# Patient Record
Sex: Female | Born: 1960 | Race: Black or African American | Hispanic: No | Marital: Single | Smoking: Current every day smoker
Health system: Southern US, Community
[De-identification: ages and names within clinical notes are randomized; demographics above are authoritative.]

## PROBLEM LIST (undated history)

## (undated) DIAGNOSIS — I10 Essential (primary) hypertension: Secondary | ICD-10-CM

## (undated) HISTORY — PX: DILATION AND CURETTAGE, DIAGNOSTIC / THERAPEUTIC: SUR384

## (undated) HISTORY — PX: RIGHT OOPHORECTOMY: SHX2359

## (undated) HISTORY — PX: DILATION AND CURETTAGE OF UTERUS: SHX78

---

## 2003-11-09 ENCOUNTER — Other Ambulatory Visit: Payer: Self-pay

## 2007-03-18 ENCOUNTER — Emergency Department (HOSPITAL_COMMUNITY): Admission: EM | Admit: 2007-03-18 | Discharge: 2007-03-18 | Payer: Self-pay | Admitting: Emergency Medicine

## 2009-08-19 ENCOUNTER — Inpatient Hospital Stay: Payer: Self-pay | Admitting: Internal Medicine

## 2009-12-02 ENCOUNTER — Ambulatory Visit: Payer: Self-pay | Admitting: Gastroenterology

## 2011-04-13 ENCOUNTER — Emergency Department: Payer: Self-pay | Admitting: Emergency Medicine

## 2011-06-30 LAB — URINALYSIS, ROUTINE W REFLEX MICROSCOPIC
Nitrite: NEGATIVE
Specific Gravity, Urine: 1.01
Urobilinogen, UA: 0.2

## 2011-06-30 LAB — DIFFERENTIAL
Basophils Absolute: 0
Basophils Relative: 0
Eosinophils Absolute: 0
Eosinophils Relative: 0
Monocytes Absolute: 0.4
Monocytes Relative: 2 — ABNORMAL LOW
Neutro Abs: 14 — ABNORMAL HIGH

## 2011-06-30 LAB — COMPREHENSIVE METABOLIC PANEL
ALT: 14
Albumin: 4.4
Alkaline Phosphatase: 143 — ABNORMAL HIGH
BUN: 6
Chloride: 110
Glucose, Bld: 122 — ABNORMAL HIGH
Potassium: 3.2 — ABNORMAL LOW
Sodium: 144
Total Bilirubin: 1.2

## 2011-06-30 LAB — URINE MICROSCOPIC-ADD ON

## 2011-06-30 LAB — CBC
HCT: 41.4
Hemoglobin: 14.7
Platelets: 418 — ABNORMAL HIGH
WBC: 16.1 — ABNORMAL HIGH

## 2011-06-30 LAB — POCT PREGNANCY, URINE
Operator id: 29011
Preg Test, Ur: NEGATIVE

## 2011-07-23 ENCOUNTER — Emergency Department: Payer: Self-pay | Admitting: Emergency Medicine

## 2016-04-17 ENCOUNTER — Encounter: Payer: Self-pay | Admitting: Emergency Medicine

## 2016-04-17 ENCOUNTER — Emergency Department: Payer: Self-pay

## 2016-04-17 DIAGNOSIS — M19019 Primary osteoarthritis, unspecified shoulder: Secondary | ICD-10-CM | POA: Insufficient documentation

## 2016-04-17 DIAGNOSIS — F1290 Cannabis use, unspecified, uncomplicated: Secondary | ICD-10-CM | POA: Insufficient documentation

## 2016-04-17 DIAGNOSIS — F17200 Nicotine dependence, unspecified, uncomplicated: Secondary | ICD-10-CM | POA: Insufficient documentation

## 2016-04-17 LAB — BASIC METABOLIC PANEL
ANION GAP: 7 (ref 5–15)
BUN: 22 mg/dL — AB (ref 6–20)
CHLORIDE: 107 mmol/L (ref 101–111)
CO2: 24 mmol/L (ref 22–32)
Calcium: 9.1 mg/dL (ref 8.9–10.3)
Creatinine, Ser: 0.61 mg/dL (ref 0.44–1.00)
GFR calc Af Amer: >60 mL/min (ref >60)
GLUCOSE: 93 mg/dL (ref 65–99)
POTASSIUM: 3.6 mmol/L (ref 3.5–5.1)
Sodium: 138 mmol/L (ref 135–145)

## 2016-04-17 LAB — CBC
HEMATOCRIT: 37.9 % (ref 35.0–47.0)
HEMOGLOBIN: 13.1 g/dL (ref 12.0–16.0)
MCH: 28.5 pg (ref 26.0–34.0)
MCHC: 34.6 g/dL (ref 32.0–36.0)
MCV: 82.2 fL (ref 80.0–100.0)
Platelets: 349 10*3/uL (ref 150–440)
RBC: 4.61 MIL/uL (ref 3.80–5.20)
RDW: 18.1 % — AB (ref 11.5–14.5)
WBC: 13 10*3/uL — AB (ref 3.6–11.0)

## 2016-04-17 LAB — TROPONIN I: Troponin I: <0.03 ng/mL (ref <0.03)

## 2016-04-17 NOTE — ED Triage Notes (Signed)
Pt states central chest pain with radiation to central upper back and shob for one week. Pt denies other symptoms. Pt states pain is worse when lying down.

## 2016-04-18 ENCOUNTER — Emergency Department: Payer: Self-pay

## 2016-04-18 ENCOUNTER — Emergency Department
Admission: EM | Admit: 2016-04-18 | Discharge: 2016-04-18 | Disposition: A | Discharge status: Home / Self Care | Payer: Self-pay | Attending: Emergency Medicine | Admitting: Emergency Medicine

## 2016-04-18 DIAGNOSIS — R079 Chest pain, unspecified: Secondary | ICD-10-CM

## 2016-04-18 DIAGNOSIS — M1990 Unspecified osteoarthritis, unspecified site: Secondary | ICD-10-CM

## 2016-04-18 LAB — TROPONIN I: Troponin I: <0.03 ng/mL (ref <0.03)

## 2016-04-18 MED ORDER — IOPAMIDOL (ISOVUE-370) INJECTION 76%
100.0000 mL | Freq: Once | INTRAVENOUS | Status: AC | PRN
  Administered 2016-04-18: 100 mL via INTRAVENOUS

## 2016-04-18 MED ORDER — MELOXICAM 15 MG PO TABS: 15.0000 mg | ORAL_TABLET | Freq: Every day | ORAL | 0 refills | Status: AC

## 2016-04-18 MED ORDER — TRAMADOL HCL 50 MG PO TABS
50.0000 mg | ORAL_TABLET | Freq: Once | ORAL | Status: AC
Start: 2016-04-18 — End: 2016-04-18
  Administered 2016-04-18: 50 mg via ORAL
  Filled 2016-04-18: qty 1

## 2016-04-18 MED ORDER — ASPIRIN 81 MG PO CHEW
324.0000 mg | CHEWABLE_TABLET | Freq: Once | ORAL | Status: AC
  Administered 2016-04-18: 324 mg via ORAL
  Filled 2016-04-18: qty 4

## 2016-04-18 NOTE — ED Provider Notes (Signed)
[facility] Emergency Department Provider Note   ____________________________________________   The patient was seen at approximately 4:16 AM   (approximate)  I have reviewed the triage vital signs and the nursing notes.   HISTORY  Chief Complaint Chest Pain    HPI Christine Pollard is a 55 y.o. female who comes into the hospital today with chest pain. The patient reports that the pain started a week ago. She reports the got really bad in the middle of the week. She reports it is mid chest pain and sharp on the right side. She's had some shortness of breath as well as some nonproductive cough. The patient denies fever. The pain is worse with deep inspiration as well as palpation to her upper chest. The patient reports she is been taking ibuprofen without any lesions. The patient rates her pain a 6 out of 10 in intensity. She denies any nausea or vomiting and reports that she has been sweaty. She also hasn't had any abdominal pain. The patient reports that she didn't know what the pain was and it persisted so she decided to come into the hospital for further evaluation.   No past medical history  There are no active problems to display for this patient.   Past Surgical History:  Procedure Laterality Date  . DILATION AND CURETTAGE, DIAGNOSTIC / THERAPEUTIC    . RIGHT OOPHORECTOMY      Prior to Admission medications   Not on File    Allergies Review of patient's allergies indicates no known allergies.  No family history on file.  Social History Social History  Substance Use Topics  . Smoking status: Current Every Day Smoker  . Smokeless tobacco: Never Used  . Alcohol use No    Review of Systems Constitutional: Sweats Eyes: No visual changes. ENT: No sore throat. Cardiovascular:  chest pain. Respiratory:  shortness of breath. Gastrointestinal: No abdominal pain.  No nausea, no vomiting.  No diarrhea.  No constipation. Genitourinary:  Negative for dysuria. Musculoskeletal: Negative for back pain. Skin: Negative for rash. Neurological: Negative for headaches, focal weakness or numbness.  10-point ROS otherwise negative.  ____________________________________________   PHYSICAL EXAM:  VITAL SIGNS: ED Triage Vitals  Enc Vitals Group     BP 04/17/16 2306 (!) 138/95     Pulse Rate 04/17/16 2306 95     Resp 04/17/16 2306 16     Temp 04/17/16 2306 98.4 F (36.9 C)     Temp Source 04/17/16 2306 Oral     SpO2 04/17/16 2306 100 %     Weight 04/17/16 2306 122 lb (55.3 kg)     Height 04/17/16 2306 5\' 5"  (1.651 m)     Head Circumference --      Peak Flow --      Pain Score 04/17/16 2307 7     Pain Loc --      Pain Edu? --      Excl. in GC? --     Constitutional: Alert and oriented. Well appearing and in mild distress. Eyes: Conjunctivae are normal. PERRL. EOMI. Head: Atraumatic. Nose: No congestion/rhinnorhea. Mouth/Throat: Mucous membranes are moist.  Oropharynx non-erythematous. Cardiovascular: Normal rate, regular rhythm. Grossly normal heart sounds.  Good peripheral circulation. Respiratory: Normal respiratory effort.  No retractions. Lungs CTAB. Anterior chest tender to palpation Gastrointestinal: Soft and nontender. No distention. Positive bowel sounds Musculoskeletal: No lower extremity tenderness nor edema.   Neurologic:  Normal speech and language.  Skin:  Skin is warm, dry  and intact.  Psychiatric: Mood and affect are normal.   ____________________________________________   LABS (all labs ordered are listed, but only abnormal results are displayed)  Labs Reviewed  BASIC METABOLIC PANEL - Abnormal; Notable for the following:       Result Value   BUN 22 (*)    All other components within normal limits  CBC - Abnormal; Notable for the following:    WBC 13.0 (*)    RDW 18.1 (*)    All other components within normal limits  TROPONIN I  TROPONIN I    ____________________________________________  EKG  ED ECG REPORT I, Rebecka Apley, the attending physician, personally viewed and interpreted this ECG.   Date: 04/17/2016  EKG Time: 2302  Rate: 97  Rhythm: normal sinus rhythm  Axis: normal  Intervals:none  ST&T Change: none  ____________________________________________  RADIOLOGY  CXR: No acute pulmonary process  CT angio chest: No acute pulmonary embolism nor acute cardiopulmonary process. 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 ____________________________________________   PROCEDURES  Procedure(s) performed: None  Procedures  Critical Care performed: No  ____________________________________________   INITIAL IMPRESSION / ASSESSMENT AND PLAN / ED COURSE  Pertinent labs & imaging results that were available during my care of the patient were reviewed by me and considered in my medical decision making (see chart for details).  This is a 55 year old female who comes into the hospital today with chest pain. The patient has had pain for a week. The patient's initial troponin is negative. I will give her some aspirin as well as tramadol and I will do a CTA of the patient's chest given her pleuritic pain. She will be reassessed.  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 I feel that that may be the cause of the patient's pain. The patient reports her pain is improved after the aspirin as well as tramadol. I will discharge the patient to home and have her follow-up with her primary care physician. She has no further complaint of concerns at this time. She'll be discharged.  ____________________________________________   FINAL CLINICAL IMPRESSION(S) / ED DIAGNOSES  Final diagnoses:  None      NEW MEDICATIONS STARTED DURING THIS VISIT:  New  Prescriptions   No medications on file     Note:  This document was prepared using Dragon voice recognition software and may include unintentional dictation errors.    Rebecka Apley, MD 04/18/16 [phone]

## 2017-06-26 IMAGING — CT CT ANGIO CHEST
2 of 6 series · 18 of 46 positions shown · IV contrast (APPLIED)
Comparison: Chest radiograph April 17, 2016

CLINICAL DATA: Central chest pain radiating to upper back,
shortness of breath for 1 week. Pain worse when lying down.

EXAM:
CT ANGIOGRAPHY CHEST WITH CONTRAST
TECHNIQUE: Multidetector CT imaging of the chest was performed using the
standard protocol during bolus administration of intravenous
contrast. Multiplanar CT image reconstructions and MIPs were
obtained to evaluate the vascular anatomy.
CONTRAST:  100 cc Isovue 370

[Series 5: thins · axial · 0.59mm/px · z∈[-552,-302]mm · 15 of 274 slices shown]
[im 12/274  lung]
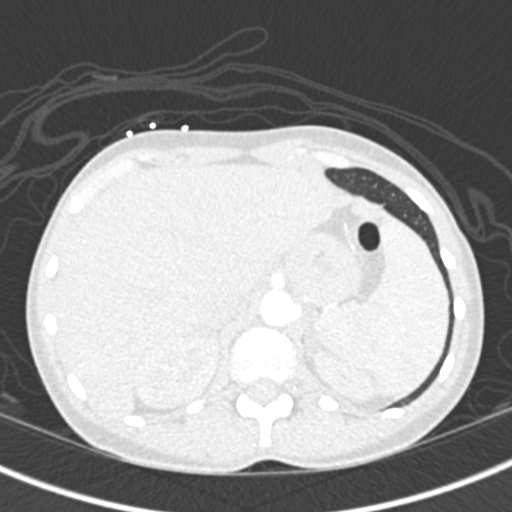
[im 36/274  soft-tissue]
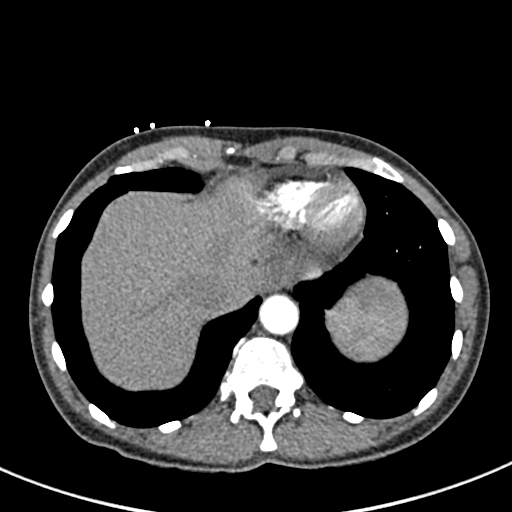
[im 48/274  lung]
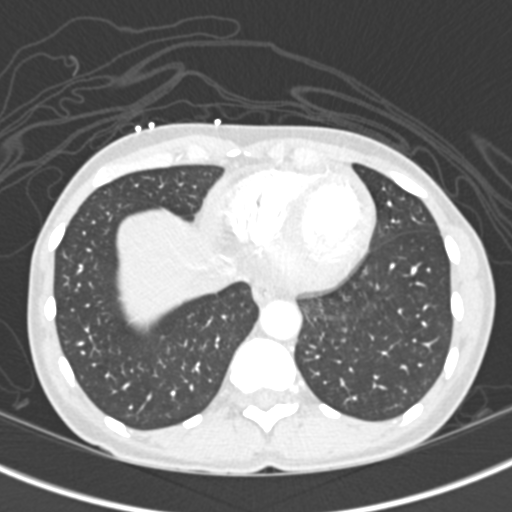
[im 72/274  soft-tissue]
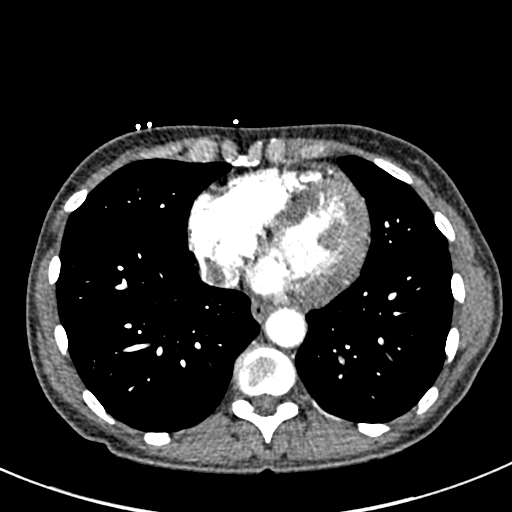
[im 84/274  lung]
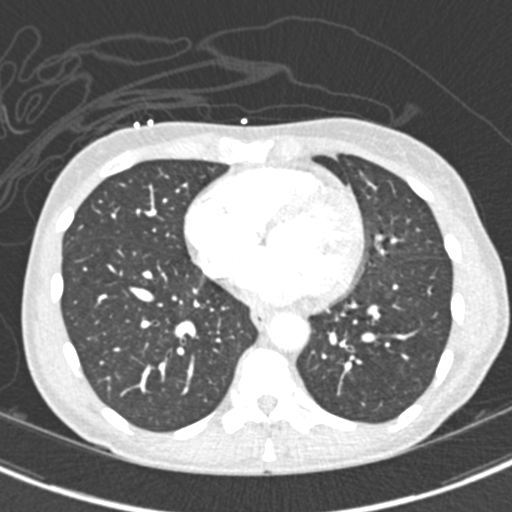
[im 107/274  soft-tissue]
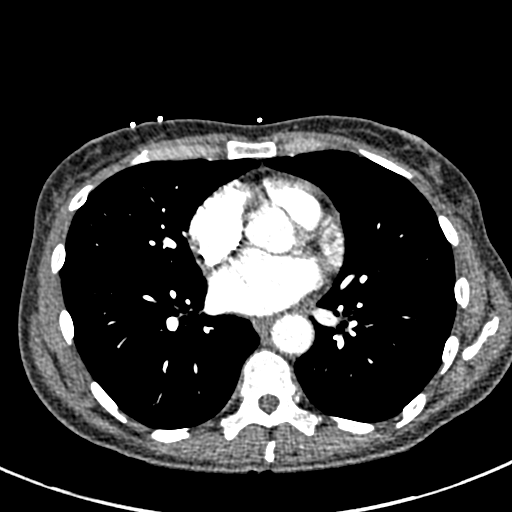
[im 119/274  lung]
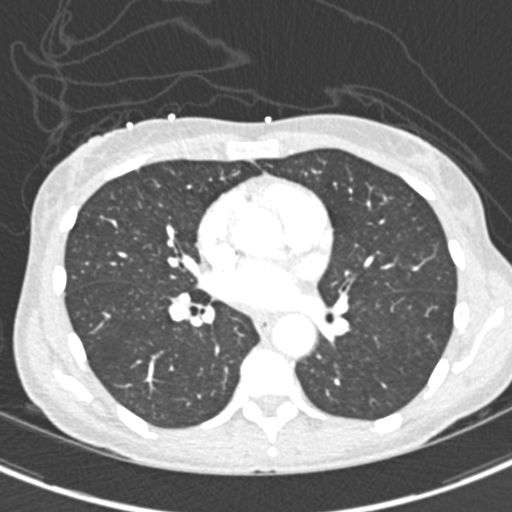
[im 143/274  soft-tissue]
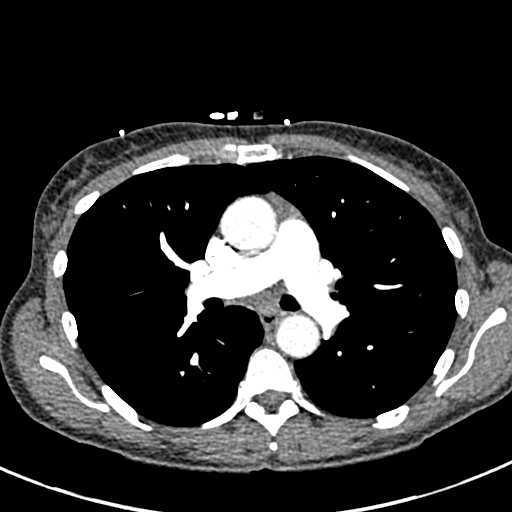
[im 155/274  lung]
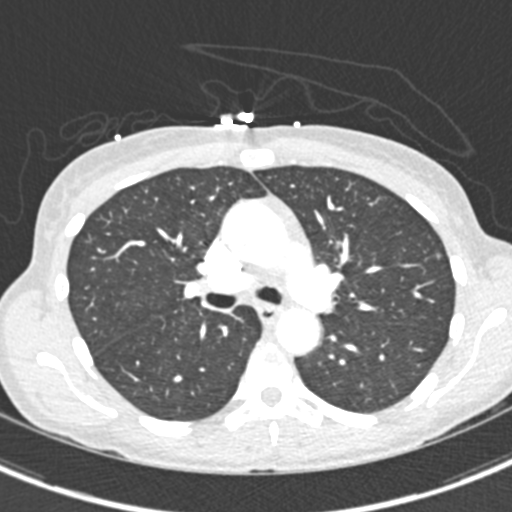
[im 167/274  soft-tissue]
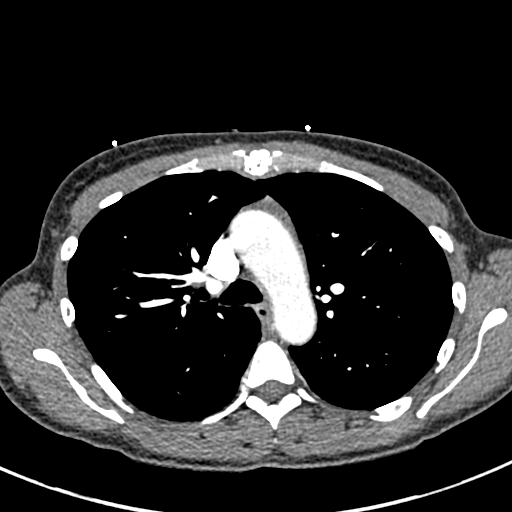
[im 190/274  lung]
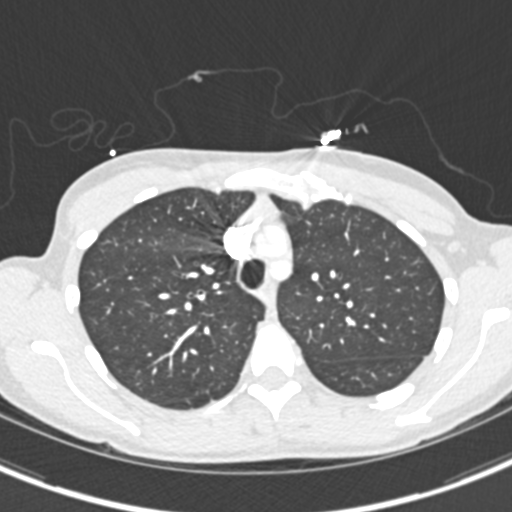
[im 202/274  soft-tissue]
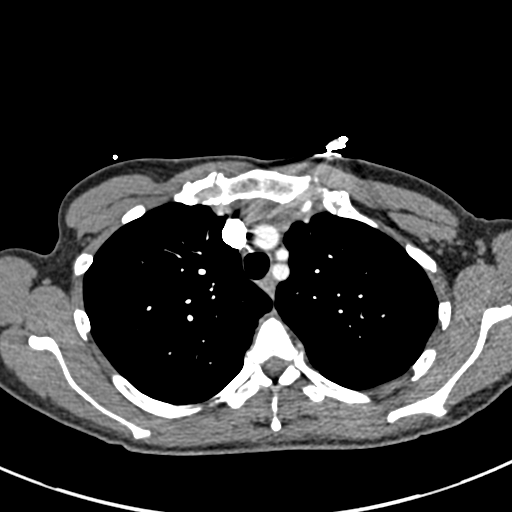
[im 226/274  lung]
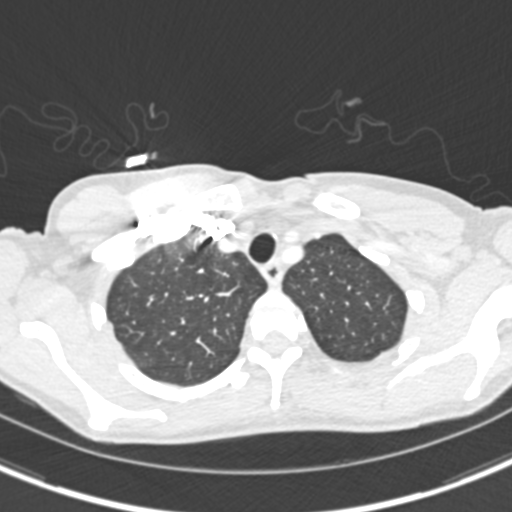
[im 238/274  soft-tissue]
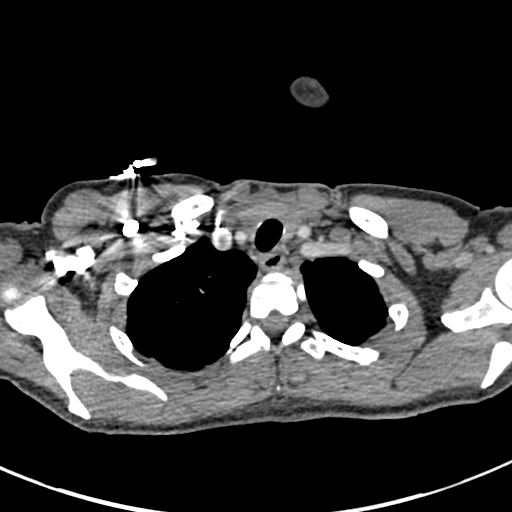
[im 262/274  lung]
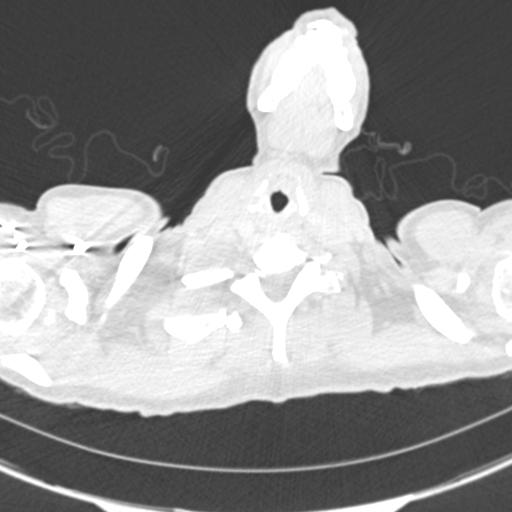

[Series 7: coronal mpr · coronal · 0.55mm/px · 3 of 101 slices shown]
[im 26/101  soft-tissue]
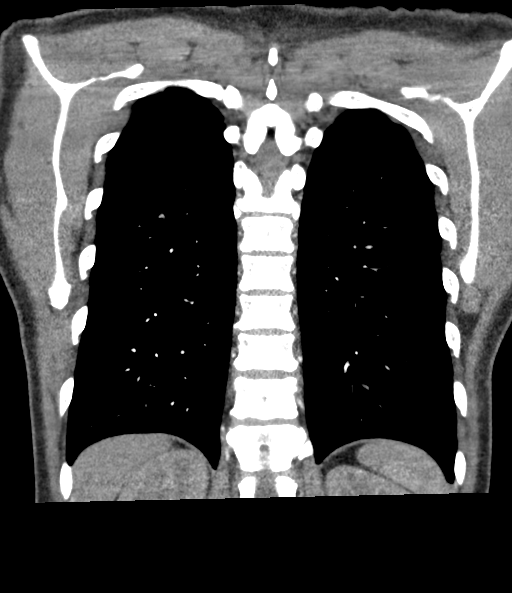
[im 51/101  soft-tissue]
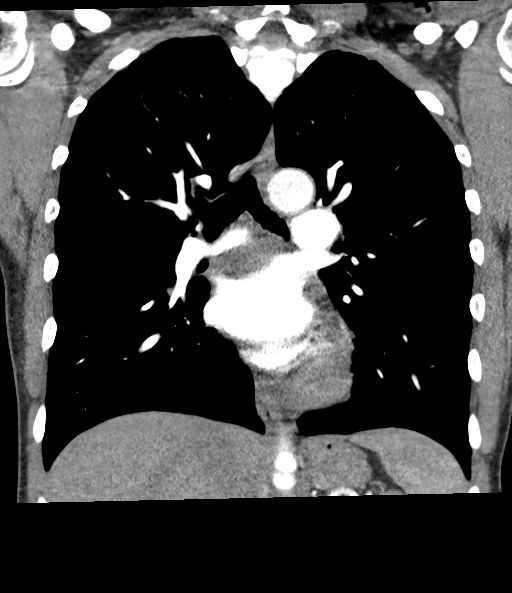
[im 76/101  soft-tissue]
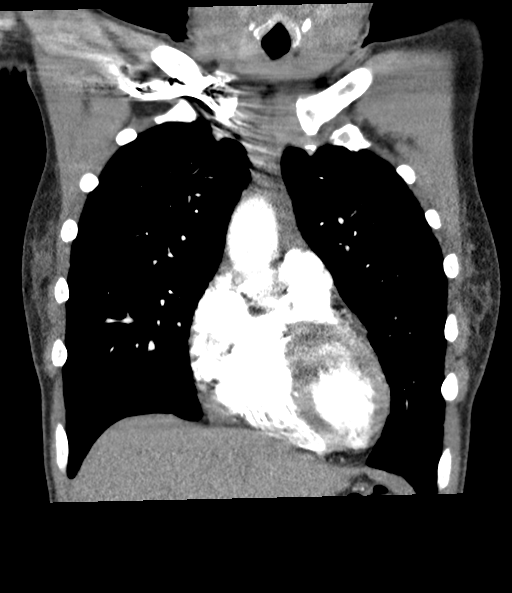

[18 of 46 positions shown; findings below may reference images not displayed]

FINDINGS: PULMONARY ARTERY: Adequate contrast opacification of the pulmonary
artery's. Main pulmonary artery is not enlarged. No pulmonary
arterial filling defects to the level of the subsegmental branches.

MEDIASTINUM: Heart is unremarkable, no right heart strain. Trace
pericardial effusion. Thoracic aorta is normal course and caliber,
trace calcific atherosclerosis. No lymphadenopathy by CT size
criteria.

LUNGS: Tracheobronchial tree is patent, no pneumothorax. No pleural
effusions, focal consolidations, pulmonary nodules or masses.

SOFT TISSUES AND OSSEOUS STRUCTURES: Included view of the abdomen is
unremarkable. Severe sternomanubrial osteoarthrosis with endplate
irregularity. Multiple periapical lucencies in the included view of
the mouth.

Review of the MIP images confirms the above findings.
IMPRESSION: No acute pulmonary embolism nor acute cardiopulmonary process.

Severe sternomanubrial osteoarthrosis with endplate irregularity,
erosive arthropathy may have this appearance.

## 2021-03-13 ENCOUNTER — Other Ambulatory Visit: Payer: Self-pay | Admitting: Primary Care

## 2021-03-13 ENCOUNTER — Ambulatory Visit
Admission: RE | Admit: 2021-03-13 | Discharge: 2021-03-13 | Disposition: A | Discharge status: Home / Self Care | Payer: Medicaid Other | Source: Ambulatory Visit | Attending: Primary Care | Admitting: Primary Care

## 2021-03-13 ENCOUNTER — Ambulatory Visit
Admission: RE | Admit: 2021-03-13 | Discharge: 2021-03-13 | Disposition: A | Discharge status: Home / Self Care | Payer: Medicaid Other | Attending: Primary Care | Admitting: Primary Care

## 2021-03-13 DIAGNOSIS — R609 Edema, unspecified: Secondary | ICD-10-CM | POA: Insufficient documentation

## 2022-02-07 ENCOUNTER — Encounter: Payer: Self-pay | Admitting: Emergency Medicine

## 2022-02-07 ENCOUNTER — Emergency Department
Admission: EM | Admit: 2022-02-07 | Discharge: 2022-02-07 | Disposition: A | Discharge status: Home / Self Care | Payer: Medicaid Other | Attending: Emergency Medicine | Admitting: Emergency Medicine

## 2022-02-07 ENCOUNTER — Other Ambulatory Visit: Payer: Self-pay

## 2022-02-07 DIAGNOSIS — K047 Periapical abscess without sinus: Secondary | ICD-10-CM

## 2022-02-07 DIAGNOSIS — I10 Essential (primary) hypertension: Secondary | ICD-10-CM | POA: Insufficient documentation

## 2022-02-07 DIAGNOSIS — K0889 Other specified disorders of teeth and supporting structures: Secondary | ICD-10-CM

## 2022-02-07 DIAGNOSIS — K029 Dental caries, unspecified: Secondary | ICD-10-CM

## 2022-02-07 HISTORY — DX: Essential (primary) hypertension: I10

## 2022-02-07 MED ORDER — AMOXICILLIN 500 MG PO CAPS: 500.0000 mg | ORAL_CAPSULE | Freq: Three times a day (TID) | ORAL | 0 refills | Status: DC

## 2022-02-07 MED ORDER — AMOXICILLIN 500 MG PO CAPS
500.0000 mg | ORAL_CAPSULE | Freq: Once | ORAL | Status: AC
  Administered 2022-02-07: 500 mg via ORAL
  Filled 2022-02-07: qty 1

## 2022-02-07 MED ORDER — IBUPROFEN 400 MG PO TABS
400.0000 mg | ORAL_TABLET | Freq: Once | ORAL | Status: AC
  Administered 2022-02-07: 400 mg via ORAL
  Filled 2022-02-07: qty 1

## 2022-02-07 MED ORDER — HYDROCODONE-ACETAMINOPHEN 5-325 MG PO TABS: 1.0000 | ORAL_TABLET | Freq: Four times a day (QID) | ORAL | 0 refills | Status: AC | PRN

## 2022-02-07 MED ORDER — LIDOCAINE VISCOUS HCL 2 % MT SOLN
15.0000 mL | Freq: Once | OROMUCOSAL | Status: AC
  Administered 2022-02-07: 15 mL via OROMUCOSAL
  Filled 2022-02-07: qty 15

## 2022-02-07 MED ORDER — ACETAMINOPHEN 325 MG PO TABS
650.0000 mg | ORAL_TABLET | Freq: Once | ORAL | Status: AC
  Administered 2022-02-07: 650 mg via ORAL
  Filled 2022-02-07: qty 2

## 2022-02-07 NOTE — Discharge Instructions (Signed)
1. Take antibiotic as prescribed (Amoxicillin 500mg three times daily x 7 days). °2. Take Ibuprofen as needed for pain; Norco as needed for more severe pain.   °3. Return to the ER for worsening symptoms, persistent vomiting, fever, difficulty breathing or other concerns. ° ° ° °

## 2022-02-07 NOTE — ED Notes (Signed)
Pt's BP elevated reports she has taken her medication does not know how hight her BP runs.

## 2022-02-07 NOTE — ED Provider Notes (Signed)
[facility] Provider Note    Event Date/Time   First MD Initiated Contact with Patient 02/07/22 0413     (approximate)   History   Dental Pain   HPI  Christine Pollard is a 61 y.o. female who presents to the ED from home with a chief complaint of right dentalgia.  Patient reports she has a dental appointment at [facility] next month.  Reports a 1 to 2-day history of right upper molar pain; presents due to facial swelling.  Denies fever, chills, chest pain, shortness of breath, abdominal pain, nausea, vomiting or dizziness.     Past Medical History   Past Medical History:  Diagnosis Date   Hypertension      Active Problem List  There are no problems to display for this patient.    Past Surgical History   Past Surgical History:  Procedure Laterality Date   DILATION AND CURETTAGE, DIAGNOSTIC / THERAPEUTIC     RIGHT OOPHORECTOMY       Home Medications   Prior to Admission medications   Medication Sig Start Date End Date Taking? Authorizing Provider  amoxicillin (AMOXIL) 500 MG capsule Take 1 capsule (500 mg total) by mouth 3 (three) times daily. 02/07/22  Yes Irean Hong, MD  HYDROcodone-acetaminophen (NORCO) 5-325 MG tablet Take 1 tablet by mouth every 6 (six) hours as needed for moderate pain. 02/07/22  Yes Irean Hong, MD     Allergies  Patient has no known allergies.   Family History  No family history on file.   Physical Exam  Triage Vital Signs: ED Triage Vitals  Enc Vitals Group     BP 02/07/22 0149 (!) 217/129     Pulse Rate 02/07/22 0149 (!) 103     Resp 02/07/22 0149 18     Temp 02/07/22 0149 98.5 F (36.9 C)     Temp Source 02/07/22 0149 Oral     SpO2 02/07/22 0149 97 %     Weight 02/07/22 0147 123 lb (55.8 kg)     Height 02/07/22 0147 5\' 3"  (1.6 m)     Head Circumference --      Peak Flow --      Pain Score 02/07/22 0147 10     Pain Loc --      Pain Edu? --      Excl. in GC? --     Updated Vital  Signs: BP (!) 197/109 (BP Location: Left Arm)   Pulse 92   Temp 98.7 F (37.1 C) (Oral)   Resp 16   Ht 5\' 3"  (1.6 m)   Wt 55.8 kg   LMP  (LMP Unknown)   SpO2 98%   BMI 21.79 kg/m    General: Awake, no distress.  CV:  RRR.  Good peripheral perfusion.  Resp:  Normal effort.  CTAB. Abd:  No distention.  Other:  Widespread dental decay, missing teeth.  Right upper molar loose with decay.  Mild right facial swelling.   ED Results / Procedures / Treatments  Labs (all labs ordered are listed, but only abnormal results are displayed) Labs Reviewed - No data to display   EKG  None   RADIOLOGY None   Official radiology report(s): No results found.   PROCEDURES:  Critical Care performed: No  Procedures   MEDICATIONS ORDERED IN ED: Medications  acetaminophen (TYLENOL) tablet 650 mg (650 mg Oral Given 02/07/22 0153)  ibuprofen (ADVIL) tablet 400 mg (400 mg Oral Given 02/07/22 0153)  lidocaine (XYLOCAINE) 2 % viscous mouth solution 15 mL (15 mLs Mouth/Throat Given 02/07/22 0520)  amoxicillin (AMOXIL) capsule 500 mg (500 mg Oral Given 02/07/22 0519)     IMPRESSION / MDM / ASSESSMENT AND PLAN / ED COURSE  I reviewed the triage vital signs and the nursing notes.                             61 year old female presenting with dentalgia and facial swelling.  Differential diagnosis includes but is not limited to dental abscess, facial cellulitis, abscess, etc.  I have personally reviewed patient's records and note mostly orthopedic visits from 2019 onwards.  Given that patient is not having systemic symptoms of sepsis, i.e. no fever, chills, nausea/vomiting, will trial oral antibiotics.  Feel CT imaging is not warranted at this time.  Patient driving; will discharge home with prescription for Norco as well as Amoxicillin.  Patient will call to move her dental visit sooner.  Strict return precautions given.  Patient verbalizes understanding and agrees with plan of care.       FINAL CLINICAL IMPRESSION(S) / ED DIAGNOSES   Final diagnoses:  Dentalgia  Pain due to dental caries  Dental infection     Rx / DC Orders   ED Discharge Orders          Ordered    amoxicillin (AMOXIL) 500 MG capsule  3 times daily        02/07/22 0427    HYDROcodone-acetaminophen (NORCO) 5-325 MG tablet  Every 6 hours PRN        02/07/22 0427             Note:  This document was prepared using Dragon voice recognition software and may include unintentional dictation errors.   Irean Hong, MD 02/07/22 [phone]

## 2022-02-07 NOTE — ED Triage Notes (Signed)
Pt presents to ER with right face swelling reports right upper dental pain, has an appointment at [facility] [place]. Pt talks in complete sentences no distress noted

## 2022-11-04 DIAGNOSIS — Z0131 Encounter for examination of blood pressure with abnormal findings: Secondary | ICD-10-CM | POA: Diagnosis not present

## 2022-11-04 DIAGNOSIS — M154 Erosive (osteo)arthritis: Secondary | ICD-10-CM | POA: Diagnosis not present

## 2022-11-04 DIAGNOSIS — Z0000 Encounter for general adult medical examination without abnormal findings: Secondary | ICD-10-CM | POA: Diagnosis not present

## 2022-11-04 DIAGNOSIS — Z1389 Encounter for screening for other disorder: Secondary | ICD-10-CM | POA: Diagnosis not present

## 2022-11-04 DIAGNOSIS — Z23 Encounter for immunization: Secondary | ICD-10-CM | POA: Diagnosis not present

## 2022-11-04 DIAGNOSIS — M7989 Other specified soft tissue disorders: Secondary | ICD-10-CM | POA: Diagnosis not present

## 2022-11-04 DIAGNOSIS — M1990 Unspecified osteoarthritis, unspecified site: Secondary | ICD-10-CM | POA: Diagnosis not present

## 2022-11-04 DIAGNOSIS — Z1331 Encounter for screening for depression: Secondary | ICD-10-CM | POA: Diagnosis not present

## 2022-11-04 DIAGNOSIS — I10 Essential (primary) hypertension: Secondary | ICD-10-CM | POA: Diagnosis not present

## 2022-11-05 ENCOUNTER — Other Ambulatory Visit: Payer: Self-pay | Admitting: Primary Care

## 2022-11-05 ENCOUNTER — Ambulatory Visit
Admission: RE | Admit: 2022-11-05 | Discharge: 2022-11-05 | Disposition: A | Discharge status: Home / Self Care | Payer: Medicaid Other | Attending: Primary Care | Admitting: Primary Care

## 2022-11-05 ENCOUNTER — Ambulatory Visit
Admission: RE | Admit: 2022-11-05 | Discharge: 2022-11-05 | Disposition: A | Discharge status: Home / Self Care | Payer: Medicaid Other | Source: Ambulatory Visit | Attending: Primary Care | Admitting: Primary Care

## 2022-11-05 DIAGNOSIS — M154 Erosive (osteo)arthritis: Secondary | ICD-10-CM | POA: Diagnosis not present

## 2022-11-05 DIAGNOSIS — R222 Localized swelling, mass and lump, trunk: Secondary | ICD-10-CM | POA: Diagnosis not present

## 2022-11-05 DIAGNOSIS — M19012 Primary osteoarthritis, left shoulder: Secondary | ICD-10-CM | POA: Diagnosis not present

## 2022-11-09 ENCOUNTER — Other Ambulatory Visit: Payer: Self-pay | Admitting: Primary Care

## 2022-11-09 DIAGNOSIS — Z1231 Encounter for screening mammogram for malignant neoplasm of breast: Secondary | ICD-10-CM

## 2022-11-12 DIAGNOSIS — M19031 Primary osteoarthritis, right wrist: Secondary | ICD-10-CM | POA: Diagnosis not present

## 2022-11-12 DIAGNOSIS — M25531 Pain in right wrist: Secondary | ICD-10-CM | POA: Diagnosis not present

## 2022-12-17 ENCOUNTER — Encounter: Payer: Self-pay | Admitting: Radiology

## 2022-12-17 ENCOUNTER — Ambulatory Visit
Admission: RE | Admit: 2022-12-17 | Discharge: 2022-12-17 | Disposition: A | Discharge status: Home / Self Care | Payer: Medicaid Other | Source: Ambulatory Visit | Attending: Primary Care | Admitting: Primary Care

## 2022-12-17 DIAGNOSIS — Z1231 Encounter for screening mammogram for malignant neoplasm of breast: Secondary | ICD-10-CM | POA: Diagnosis not present

## 2023-01-06 DIAGNOSIS — M545 Low back pain, unspecified: Secondary | ICD-10-CM | POA: Diagnosis not present

## 2023-01-06 DIAGNOSIS — I10 Essential (primary) hypertension: Secondary | ICD-10-CM | POA: Diagnosis not present

## 2023-01-21 ENCOUNTER — Other Ambulatory Visit: Payer: Self-pay

## 2023-01-29 ENCOUNTER — Encounter: Payer: Self-pay | Admitting: *Deleted

## 2023-02-02 ENCOUNTER — Telehealth: Payer: Self-pay

## 2023-02-02 ENCOUNTER — Telehealth: Payer: Self-pay | Admitting: *Deleted

## 2023-02-02 ENCOUNTER — Other Ambulatory Visit: Payer: Self-pay | Admitting: *Deleted

## 2023-02-02 DIAGNOSIS — Z1211 Encounter for screening for malignant neoplasm of colon: Secondary | ICD-10-CM

## 2023-02-02 MED ORDER — PEG 3350-KCL-NABCB-NACL-NASULF 236 G PO SOLR: 4000.0000 mL | Freq: Once | ORAL | 0 refills | Status: AC

## 2023-02-02 NOTE — Telephone Encounter (Signed)
Message left for patient to return my call.  

## 2023-02-02 NOTE — Telephone Encounter (Signed)
Gastroenterology Pre-Procedure Review  Request Date: 04/05/2023 Requesting Physician: Dr. Allegra Lai  PATIENT REVIEW QUESTIONS: The patient responded to the following health history questions as indicated:    1. Are you having any GI issues? no 2. Do you have a personal history of Polyps? no 3. Do you have a family history of Colon Cancer or Polyps? no 4. Diabetes Mellitus? no 5. Joint replacements in the past 12 months?no 6. Major health problems in the past 3 months?no 7. Any artificial heart valves, MVP, or defibrillator?no    MEDICATIONS & ALLERGIES:    Patient reports the following regarding taking any anticoagulation/antiplatelet therapy:   Plavix, Coumadin, Eliquis, Xarelto, Lovenox, Pradaxa, Brilinta, or Effient? no Aspirin? yes (81 mg)  Patient confirms/reports the following medications:  Current Outpatient Medications  Medication Sig Dispense Refill   amLODipine-benazepril (LOTREL) 10-20 MG capsule Take 1 capsule by mouth daily.     amoxicillin (AMOXIL) 500 MG capsule Take 1 capsule (500 mg total) by mouth 3 (three) times daily. 21 capsule 0   cyclobenzaprine (FLEXERIL) 10 MG tablet Take 10 mg by mouth at bedtime.     HYDROcodone-acetaminophen (NORCO) 5-325 MG tablet Take 1 tablet by mouth every 6 (six) hours as needed for moderate pain. 15 tablet 0   meloxicam (MOBIC) 15 MG tablet Take 15 mg by mouth daily.     No current facility-administered medications for this visit.    Patient confirms/reports the following allergies:  Allergies  Allergen Reactions   Chlorthalidone     Other reaction(s): NAUSEA    No orders of the defined types were placed in this encounter.   AUTHORIZATION INFORMATION Primary Insurance: 1D#: Group #:  Secondary Insurance: 1D#: Group #:  SCHEDULE INFORMATION: Date: 04/05/2023 Time: Location:  [facility]

## 2023-02-02 NOTE — Telephone Encounter (Signed)
Colonoscopy schedule on 04/05/2023 with Dr Allegra Lai at [facility]

## 2023-02-02 NOTE — Telephone Encounter (Signed)
Patient is calling to schedule her colonoscopy. Please call patient

## 2023-02-04 DIAGNOSIS — M7022 Olecranon bursitis, left elbow: Secondary | ICD-10-CM | POA: Diagnosis not present

## 2023-02-04 DIAGNOSIS — Z1389 Encounter for screening for other disorder: Secondary | ICD-10-CM | POA: Diagnosis not present

## 2023-02-04 DIAGNOSIS — Z0131 Encounter for examination of blood pressure with abnormal findings: Secondary | ICD-10-CM | POA: Diagnosis not present

## 2023-02-04 DIAGNOSIS — I10 Essential (primary) hypertension: Secondary | ICD-10-CM | POA: Diagnosis not present

## 2023-02-23 DIAGNOSIS — M272 Inflammatory conditions of jaws: Secondary | ICD-10-CM | POA: Diagnosis not present

## 2023-03-09 DIAGNOSIS — M7022 Olecranon bursitis, left elbow: Secondary | ICD-10-CM | POA: Diagnosis not present

## 2023-03-09 DIAGNOSIS — Z0130 Encounter for examination of blood pressure without abnormal findings: Secondary | ICD-10-CM | POA: Diagnosis not present

## 2023-03-09 DIAGNOSIS — I10 Essential (primary) hypertension: Secondary | ICD-10-CM | POA: Diagnosis not present

## 2023-03-09 DIAGNOSIS — Z1389 Encounter for screening for other disorder: Secondary | ICD-10-CM | POA: Diagnosis not present

## 2023-03-31 ENCOUNTER — Encounter: Payer: Self-pay | Admitting: Gastroenterology

## 2023-04-05 ENCOUNTER — Ambulatory Visit: Payer: Medicaid Other | Admitting: Anesthesiology

## 2023-04-05 ENCOUNTER — Encounter
Admission: RE | Disposition: A | Discharge status: Home / Self Care | Payer: Self-pay | Source: Home / Self Care | Attending: Gastroenterology

## 2023-04-05 ENCOUNTER — Encounter: Payer: Self-pay | Admitting: Gastroenterology

## 2023-04-05 ENCOUNTER — Ambulatory Visit
Admission: RE | Admit: 2023-04-05 | Discharge: 2023-04-05 | Disposition: A | Discharge status: Home / Self Care | Payer: Medicaid Other | Source: Home / Self Care | Attending: Gastroenterology | Admitting: Gastroenterology

## 2023-04-05 DIAGNOSIS — Z1211 Encounter for screening for malignant neoplasm of colon: Secondary | ICD-10-CM | POA: Diagnosis not present

## 2023-04-05 DIAGNOSIS — I10 Essential (primary) hypertension: Secondary | ICD-10-CM | POA: Diagnosis not present

## 2023-04-05 DIAGNOSIS — F17200 Nicotine dependence, unspecified, uncomplicated: Secondary | ICD-10-CM | POA: Diagnosis not present

## 2023-04-05 HISTORY — PX: COLONOSCOPY WITH PROPOFOL: SHX5780

## 2023-04-05 SURGERY — COLONOSCOPY WITH PROPOFOL
Anesthesia: General

## 2023-04-05 MED ORDER — STERILE WATER FOR IRRIGATION IR SOLN
Status: DC | PRN
  Administered 2023-04-05 (×2): 50 mL
  Administered 2023-04-05: 100 mL
  Administered 2023-04-05: 50 mL

## 2023-04-05 MED ORDER — PROPOFOL 10 MG/ML IV BOLUS
INTRAVENOUS | Status: DC | PRN
  Administered 2023-04-05: 20 mg via INTRAVENOUS
  Administered 2023-04-05: 25 mg via INTRAVENOUS
  Administered 2023-04-05: 150 ug/kg/min via INTRAVENOUS

## 2023-04-05 MED ORDER — LABETALOL HCL 5 MG/ML IV SOLN
INTRAVENOUS | Status: AC
  Filled 2023-04-05: qty 4

## 2023-04-05 MED ORDER — LIDOCAINE HCL (CARDIAC) PF 100 MG/5ML IV SOSY
PREFILLED_SYRINGE | INTRAVENOUS | Status: DC | PRN
  Administered 2023-04-05: 50 mg via INTRAVENOUS

## 2023-04-05 MED ORDER — SODIUM CHLORIDE 0.9 % IV SOLN
INTRAVENOUS | Status: DC
  Administered 2023-04-05: 1000 mL via INTRAVENOUS

## 2023-04-05 MED ORDER — LABETALOL HCL 5 MG/ML IV SOLN
INTRAVENOUS | Status: DC | PRN
  Administered 2023-04-05 (×3): 5 mg via INTRAVENOUS

## 2023-04-05 NOTE — H&P (Signed)
Arlyss Repress, MD [address]  [address]  [place], [place] [address]  Main: [phone]  Fax: [phone] Pager: [phone]  Primary Care Physician:  Sandrea Hughs, NP Primary Gastroenterologist:  Dr. Arlyss Repress  Pre-Procedure History & Physical: HPI:  Christine Pollard is a 62 y.o. female is here for an colonoscopy.   Past Medical History:  Diagnosis Date   Hypertension     Past Surgical History:  Procedure Laterality Date   DILATION AND CURETTAGE OF UTERUS     DILATION AND CURETTAGE, DIAGNOSTIC / THERAPEUTIC     RIGHT OOPHORECTOMY      Prior to Admission medications   Medication Sig Start Date End Date Taking? Authorizing Provider  amLODipine-benazepril (LOTREL) 10-20 MG capsule Take 1 capsule by mouth daily. 02/01/23  Yes [provider]  cyclobenzaprine (FLEXERIL) 10 MG tablet Take 10 mg by mouth at bedtime. 02/01/23  Yes [provider]  HYDROcodone-acetaminophen (NORCO) 5-325 MG tablet Take 1 tablet by mouth every 6 (six) hours as needed for moderate pain. 02/07/22  Yes Irean Hong, MD  meloxicam (MOBIC) 15 MG tablet Take 15 mg by mouth daily.   Yes [provider]  amoxicillin (AMOXIL) 500 MG capsule Take 1 capsule (500 mg total) by mouth 3 (three) times daily. Patient not taking: Reported on 04/05/2023 02/07/22   Irean Hong, MD    Allergies as of 02/02/2023 - Review Complete 02/07/2022  Allergen Reaction Noted   Chlorthalidone  01/02/2013    Family History  Problem Relation Age of Onset   Breast cancer Paternal Grandmother     Social History   Socioeconomic History   Marital status: Single    Spouse name: Not on file   Number of children: Not on file   Years of education: Not on file   Highest education level: Not on file  Occupational History   Not on file  Tobacco Use   Smoking status: Every Day   Smokeless tobacco: Never  Vaping Use   Vaping status: Never Used  Substance and Sexual Activity   Alcohol  use: No   Drug use: Yes    Types: Marijuana    Comment: none in past week   Sexual activity: Not on file  Other Topics Concern   Not on file  Social History Narrative   Not on file   Social Determinants of Health   Financial Resource Strain: Not on file  Food Insecurity: Not on file  Transportation Needs: Not on file  Physical Activity: Not on file  Stress: Not on file  Social Connections: Not on file  Intimate Partner Violence: Not on file    Review of Systems: See HPI, otherwise negative ROS  Physical Exam: BP (!) 172/99   Pulse (!) 102   Temp (!) 97.1 F (36.2 C) (Temporal)   Resp 16   Ht 5\' 5"  (1.651 m)   Wt 56.7 kg   LMP  (LMP Unknown)   SpO2 100%   BMI 20.81 kg/m  General:   Alert,  pleasant and cooperative in NAD Head:  Normocephalic and atraumatic. Neck:  Supple; no masses or thyromegaly. Lungs:  Clear throughout to auscultation.    Heart:  Regular rate and rhythm. Abdomen:  Soft, nontender and nondistended. Normal bowel sounds, without guarding, and without rebound.   Neurologic:  Alert and  oriented x4;  grossly normal neurologically.  Impression/Plan: Christine Pollard is here for an colonoscopy to be performed for colon cancer screening  Risks, benefits, limitations, and alternatives regarding  colonoscopy have been reviewed with the patient.  Questions have been answered.  All parties agreeable.   Lannette Donath, MD  04/05/2023, 10:54 AM

## 2023-04-05 NOTE — Anesthesia Preprocedure Evaluation (Signed)
Anesthesia Evaluation  Patient identified by MRN, date of birth, ID band Patient awake    Reviewed: Allergy & Precautions, NPO status , Patient's Chart, lab work & pertinent test results  History of Anesthesia Complications Negative for: history of anesthetic complications  Airway Mallampati: III  TM Distance: <3 FB Neck ROM: full    Dental  (+) Chipped, Poor Dentition, Missing   Pulmonary COPD, Current Smoker    + decreased breath sounds      Cardiovascular Exercise Tolerance: Good hypertension, (-) angina Normal cardiovascular exam     Neuro/Psych negative neurological ROS  negative psych ROS   GI/Hepatic negative GI ROS, Neg liver ROS,neg GERD  ,,  Endo/Other  negative endocrine ROS    Renal/GU negative Renal ROS  negative genitourinary   Musculoskeletal   Abdominal   Peds  Hematology negative hematology ROS (+)   Anesthesia Other Findings Past Medical History: No date: Hypertension  Past Surgical History: No date: DILATION AND CURETTAGE OF UTERUS No date: DILATION AND CURETTAGE, DIAGNOSTIC / THERAPEUTIC No date: RIGHT OOPHORECTOMY  BMI    Body Mass Index: 20.81 kg/m      Reproductive/Obstetrics negative OB ROS                             Anesthesia Physical Anesthesia Plan  ASA: 3  Anesthesia Plan: General   Post-op Pain Management:    Induction: Intravenous  PONV Risk Score and Plan: Propofol infusion and TIVA  Airway Management Planned: Natural Airway and Nasal Cannula  Additional Equipment:   Intra-op Plan:   Post-operative Plan:   Informed Consent: I have reviewed the patients History and Physical, chart, labs and discussed the procedure including the risks, benefits and alternatives for the proposed anesthesia with the patient or authorized representative who has indicated his/her understanding and acceptance.     Dental Advisory Given  Plan Discussed  with: Anesthesiologist, CRNA and Surgeon  Anesthesia Plan Comments: (Patient consented for risks of anesthesia including but not limited to:  - adverse reactions to medications - risk of airway placement if required - damage to eyes, teeth, lips or other oral mucosa - nerve damage due to positioning  - sore throat or hoarseness - Damage to heart, brain, nerves, lungs, other parts of body or loss of life  Patient voiced understanding.)       Anesthesia Quick Evaluation

## 2023-04-05 NOTE — Transfer of Care (Signed)
Immediate Anesthesia Transfer of Care Note  Patient: Christine Pollard  Procedure(s) Performed: COLONOSCOPY WITH PROPOFOL  Patient Location: PACU  Anesthesia Type:General  Level of Consciousness: drowsy  Airway & Oxygen Therapy: Patient Spontanous Breathing  Post-op Assessment: Report given to RN  Post vital signs: stable  Last Vitals:  Vitals Value Taken Time  BP    Temp    Pulse 85 04/05/23 1131  Resp 20 04/05/23 1131  SpO2 100 % 04/05/23 1131  Vitals shown include unfiled device data.  Last Pain:  Vitals:   04/05/23 1005  TempSrc: Temporal  PainSc: 0-No pain         Complications: No notable events documented.

## 2023-04-05 NOTE — Op Note (Signed)
[facility]astroenterology Patient Name: Christine Pollard Procedure Date: 04/05/2023 10:54 AM MRN: [id number] Account #: [id number] Date of Birth: 19-Sep-1960 Admit Type: Outpatient Age: 62 Room: [facility] ENDO ROOM 4 Gender: Female Note Status: Finalized Instrument Name: Peds Colonoscope [id number] Procedure:             Colonoscopy Indications:           Screening for colorectal malignant neoplasm, This is                         the patient's first colonoscopy Providers:             Toney Reil MD, MD Referring MD:          Dorcas Mcmurray (Referring MD) Medicines:             General Anesthesia Complications:         No immediate complications. Estimated blood loss: None. Procedure:             Pre-Anesthesia Assessment:                        - Prior to the procedure, a History and Physical was                         performed, and patient medications and allergies were                         reviewed. The patient is competent. The risks and                         benefits of the procedure and the sedation options and                         risks were discussed with the patient. All questions                         were answered and informed consent was obtained.                         Patient identification and proposed procedure were                         verified by the physician, the nurse, the                         anesthesiologist, the anesthetist and the technician                         in the pre-procedure area in the procedure room in the                         endoscopy suite. Mental Status Examination: alert and                         oriented. Airway Examination: normal oropharyngeal                         airway and neck mobility. Respiratory Examination:  clear to auscultation. CV Examination: normal.                         Prophylactic Antibiotics: The patient does not require                          prophylactic antibiotics. Prior Anticoagulants: The                         patient has taken no anticoagulant or antiplatelet                         agents. ASA Grade Assessment: II - A patient with mild                         systemic disease. After reviewing the risks and                         benefits, the patient was deemed in satisfactory                         condition to undergo the procedure. The anesthesia                         plan was to use general anesthesia. Immediately prior                         to administration of medications, the patient was                         re-assessed for adequacy to receive sedatives. The                         heart rate, respiratory rate, oxygen saturations,                         blood pressure, adequacy of pulmonary ventilation, and                         response to care were monitored throughout the                         procedure. The physical status of the patient was                         re-assessed after the procedure.                        After obtaining informed consent, the colonoscope was                         passed under direct vision. Throughout the procedure,                         the patient's blood pressure, pulse, and oxygen                         saturations were monitored continuously. The  Colonoscope was introduced through the anus and                         advanced to the the cecum, identified by appendiceal                         orifice and ileocecal valve. The colonoscopy was                         performed with moderate difficulty due to significant                         looping and a tortuous colon. Successful completion of                         the procedure was aided by applying abdominal                         pressure. The patient tolerated the procedure well.                         The quality of the bowel preparation was adequate to                          identify polyps greater than 5 mm in size. The                         ileocecal valve, appendiceal orifice, and rectum were                         photographed. Findings:      The perianal and digital rectal examinations were normal. Pertinent       negatives include normal sphincter tone and no palpable rectal lesions.      The entire examined colon appeared normal.      The retroflexed view of the distal rectum and anal verge was normal and       showed no anal or rectal abnormalities. Impression:            - The entire examined colon is normal.                        - The distal rectum and anal verge are normal on                         retroflexion view.                        - No specimens collected. Recommendation:        - Discharge patient to home (with escort).                        - Resume previous diet today.                        - Continue present medications.                        - Repeat colonoscopy in 10 years for screening  purposes. Procedure Code(s):     --- Professional ---                        Q2595, Colorectal cancer screening; colonoscopy on                         individual not meeting criteria for high risk Diagnosis Code(s):     --- Professional ---                        Z12.11, Encounter for screening for malignant neoplasm                         of colon CPT copyright 2022 [employer]. All rights reserved. The codes documented in this report are preliminary and upon coder review may  be revised to meet current compliance requirements. Dr. Libby Maw Toney Reil MD, MD 04/05/2023 11:30:36 AM This report has been signed electronically. Number of Addenda: 0 Note Initiated On: 04/05/2023 10:54 AM Scope Withdrawal Time: 0 hours 6 minutes 58 seconds  Total Procedure Duration: 0 hours 16 minutes 30 seconds  Estimated Blood Loss:  Estimated blood loss: none.      [facility]

## 2023-04-05 NOTE — Anesthesia Postprocedure Evaluation (Signed)
Anesthesia Post Note  Patient: Christine Pollard  Procedure(s) Performed: COLONOSCOPY WITH PROPOFOL  Patient location during evaluation: Endoscopy Anesthesia Type: General Level of consciousness: awake and alert Pain management: pain level controlled Vital Signs Assessment: post-procedure vital signs reviewed and stable Respiratory status: spontaneous breathing, nonlabored ventilation, respiratory function stable and patient connected to nasal cannula oxygen Cardiovascular status: blood pressure returned to baseline and stable Postop Assessment: no apparent nausea or vomiting Anesthetic complications: no   There were no known notable events for this encounter.   Last Vitals:  Vitals:   04/05/23 1141 04/05/23 1151  BP:  (!) 157/98  Pulse: 85   Resp:    Temp:    SpO2: 100%     Last Pain:  Vitals:   04/05/23 1151  TempSrc:   PainSc: 0-No pain                 Cleda Mccreedy Susano Cleckler

## 2023-04-06 ENCOUNTER — Encounter: Payer: Self-pay | Admitting: Gastroenterology

## 2023-07-05 DIAGNOSIS — M278 Other specified diseases of jaws: Secondary | ICD-10-CM | POA: Diagnosis not present

## 2023-07-05 DIAGNOSIS — M1909 Primary osteoarthritis, other specified site: Secondary | ICD-10-CM | POA: Diagnosis not present

## 2023-08-26 DIAGNOSIS — Z87891 Personal history of nicotine dependence: Secondary | ICD-10-CM | POA: Diagnosis not present

## 2023-08-26 DIAGNOSIS — D165 Benign neoplasm of lower jaw bone: Secondary | ICD-10-CM | POA: Diagnosis not present

## 2023-08-26 DIAGNOSIS — M272 Inflammatory conditions of jaws: Secondary | ICD-10-CM | POA: Diagnosis not present

## 2023-08-26 DIAGNOSIS — D164 Benign neoplasm of bones of skull and face: Secondary | ICD-10-CM | POA: Diagnosis not present

## 2023-08-26 DIAGNOSIS — M869 Osteomyelitis, unspecified: Secondary | ICD-10-CM | POA: Diagnosis not present

## 2023-08-26 DIAGNOSIS — I10 Essential (primary) hypertension: Secondary | ICD-10-CM | POA: Diagnosis not present

## 2023-10-14 DIAGNOSIS — Z1331 Encounter for screening for depression: Secondary | ICD-10-CM | POA: Diagnosis not present

## 2023-10-14 DIAGNOSIS — Z0130 Encounter for examination of blood pressure without abnormal findings: Secondary | ICD-10-CM | POA: Diagnosis not present

## 2023-10-14 DIAGNOSIS — Z1389 Encounter for screening for other disorder: Secondary | ICD-10-CM | POA: Diagnosis not present

## 2023-10-14 DIAGNOSIS — Z23 Encounter for immunization: Secondary | ICD-10-CM | POA: Diagnosis not present

## 2023-10-14 DIAGNOSIS — M79642 Pain in left hand: Secondary | ICD-10-CM | POA: Diagnosis not present

## 2023-10-19 DIAGNOSIS — Z0130 Encounter for examination of blood pressure without abnormal findings: Secondary | ICD-10-CM | POA: Diagnosis not present

## 2023-10-19 DIAGNOSIS — Z1389 Encounter for screening for other disorder: Secondary | ICD-10-CM | POA: Diagnosis not present

## 2023-10-19 DIAGNOSIS — M79642 Pain in left hand: Secondary | ICD-10-CM | POA: Diagnosis not present

## 2023-10-21 DIAGNOSIS — M1812 Unilateral primary osteoarthritis of first carpometacarpal joint, left hand: Secondary | ICD-10-CM | POA: Diagnosis not present

## 2023-10-21 DIAGNOSIS — M189 Osteoarthritis of first carpometacarpal joint, unspecified: Secondary | ICD-10-CM | POA: Diagnosis not present

## 2023-11-11 DIAGNOSIS — G5602 Carpal tunnel syndrome, left upper limb: Secondary | ICD-10-CM | POA: Diagnosis not present

## 2023-11-11 DIAGNOSIS — M189 Osteoarthritis of first carpometacarpal joint, unspecified: Secondary | ICD-10-CM | POA: Diagnosis not present

## 2023-12-28 DIAGNOSIS — G5602 Carpal tunnel syndrome, left upper limb: Secondary | ICD-10-CM | POA: Diagnosis not present

## 2023-12-28 DIAGNOSIS — M1812 Unilateral primary osteoarthritis of first carpometacarpal joint, left hand: Secondary | ICD-10-CM | POA: Diagnosis not present

## 2024-01-03 DIAGNOSIS — M189 Osteoarthritis of first carpometacarpal joint, unspecified: Secondary | ICD-10-CM | POA: Diagnosis not present

## 2024-01-03 DIAGNOSIS — M1812 Unilateral primary osteoarthritis of first carpometacarpal joint, left hand: Secondary | ICD-10-CM | POA: Diagnosis not present

## 2024-01-06 DIAGNOSIS — M272 Inflammatory conditions of jaws: Secondary | ICD-10-CM | POA: Diagnosis not present

## 2024-01-06 DIAGNOSIS — M278 Other specified diseases of jaws: Secondary | ICD-10-CM | POA: Diagnosis not present

## 2024-01-20 DIAGNOSIS — M272 Inflammatory conditions of jaws: Secondary | ICD-10-CM | POA: Diagnosis not present

## 2024-02-03 DIAGNOSIS — G5602 Carpal tunnel syndrome, left upper limb: Secondary | ICD-10-CM | POA: Diagnosis not present

## 2024-02-14 DIAGNOSIS — M278 Other specified diseases of jaws: Secondary | ICD-10-CM | POA: Diagnosis not present

## 2024-02-24 DIAGNOSIS — M1812 Unilateral primary osteoarthritis of first carpometacarpal joint, left hand: Secondary | ICD-10-CM | POA: Diagnosis not present

## 2024-02-24 DIAGNOSIS — G5602 Carpal tunnel syndrome, left upper limb: Secondary | ICD-10-CM | POA: Diagnosis not present

## 2024-02-24 DIAGNOSIS — M272 Inflammatory conditions of jaws: Secondary | ICD-10-CM | POA: Diagnosis not present

## 2024-02-24 DIAGNOSIS — A429 Actinomycosis, unspecified: Secondary | ICD-10-CM | POA: Diagnosis not present

## 2024-04-20 DIAGNOSIS — G5602 Carpal tunnel syndrome, left upper limb: Secondary | ICD-10-CM | POA: Diagnosis not present

## 2024-06-01 DIAGNOSIS — A429 Actinomycosis, unspecified: Secondary | ICD-10-CM | POA: Diagnosis not present

## 2024-06-01 DIAGNOSIS — M272 Inflammatory conditions of jaws: Secondary | ICD-10-CM | POA: Diagnosis not present
